# Patient Record
Sex: Female | Born: 1965 | Race: Black or African American | Hispanic: No | Marital: Married | Smoking: Never smoker
Health system: Southern US, Community
[De-identification: ages and names within clinical notes are randomized; demographics above are authoritative.]

## PROBLEM LIST (undated history)

## (undated) DIAGNOSIS — D649 Anemia, unspecified: Secondary | ICD-10-CM

## (undated) DIAGNOSIS — L709 Acne, unspecified: Secondary | ICD-10-CM

## (undated) HISTORY — PX: TUBAL LIGATION: SHX77

## (undated) HISTORY — PX: COLONOSCOPY: SHX174

## (undated) HISTORY — PX: EYE SURGERY: SHX253

---

## 2015-02-02 ENCOUNTER — Other Ambulatory Visit: Payer: Self-pay | Admitting: Obstetrics and Gynecology

## 2015-02-02 NOTE — Patient Instructions (Addendum)
   Your procedure is scheduled on:  Wednesday, April 6  Enter through the [employer] [facility] at: 10 AM Pick up the phone at the desk and dial [phone] and inform us of your arrival.  Please call this number if you have any problems the morning of surgery: [phone]  Remember: Do not eat or drink after midnight: Tuesday Take these medicines the morning of surgery with a SIP OF WATER:  None  Do not wear jewelry, make-up, or FINGER nail polish No metal in your hair or on your body. Do not wear lotions, powders, perfumes.  You may wear deodorant.  Do not bring valuables to the hospital. Contacts, dentures or bridgework may not be worn into surgery.  Leave suitcase in the car. After Surgery it may be brought to your room. For patients being admitted to the hospital, checkout time is 11:00am the day of discharge.  Home with boyfriend  Stark [facility] cell [phone]

## 2015-02-05 ENCOUNTER — Encounter (HOSPITAL_COMMUNITY)
Admission: RE | Admit: 2015-02-05 | Discharge: 2015-02-05 | Disposition: A | Discharge status: Home / Self Care | Payer: BLUE CROSS/BLUE SHIELD | Source: Ambulatory Visit | Attending: Obstetrics and Gynecology | Admitting: Obstetrics and Gynecology

## 2015-02-05 ENCOUNTER — Encounter (HOSPITAL_COMMUNITY): Payer: Self-pay

## 2015-02-05 ENCOUNTER — Other Ambulatory Visit (HOSPITAL_COMMUNITY): Payer: Self-pay

## 2015-02-05 DIAGNOSIS — Z01812 Encounter for preprocedural laboratory examination: Secondary | ICD-10-CM | POA: Insufficient documentation

## 2015-02-05 HISTORY — DX: Acne, unspecified: L70.9

## 2015-02-05 HISTORY — DX: Anemia, unspecified: D64.9

## 2015-02-05 LAB — CBC
HCT: 31.4 % — ABNORMAL LOW (ref 36.0–46.0)
HEMOGLOBIN: 9.6 g/dL — AB (ref 12.0–15.0)
MCH: 26 pg (ref 26.0–34.0)
MCHC: 30.6 g/dL (ref 30.0–36.0)
MCV: 85.1 fL (ref 78.0–100.0)
PLATELETS: 269 10*3/uL (ref 150–400)
RBC: 3.69 MIL/uL — ABNORMAL LOW (ref 3.87–5.11)
RDW: 14.4 % (ref 11.5–15.5)
WBC: 4.3 10*3/uL (ref 4.0–10.5)

## 2015-02-20 NOTE — H&P (Signed)
  Admission History and Physical Exam for a Gynecology Patient  Karen Sheppard is a 49 y.o. female, No obstetric history on file., who presents for a vaginal hysterectomy. She has been followed at the [facility] of [facility]. The patient complains of menorrhagia, fatigue, and anemia.  She has been treated with a Mirena IUD and with transamic acid.  Her most recent Pap smear was in April 2015.  It was within normal limits.  She denies a history of abnormal Pap smears.  She had endometrial biopsy that was benign.  An ultrasound showed an 8.5 x 7.7 cm uterus.  There is a 5.3 cm fibroid present.  The adnexa appeared normal.  The patient has had a cesarean section and a tubal sterilization procedure.  Obstetrical history:  The patient has had one cesarean section.  She is a gravida 1 para 1.  Past Medical History  Diagnosis Date  . Acne   . Anemia     No prescriptions prior to admission    Past Surgical History  Procedure Laterality Date  . Tubal ligation    . Eye surgery      bilateral cataract surgery  . Cesarean section      x 1  . Colonoscopy      Allergies  Allergen Reactions  . Latex Rash    Family History:   The patient's mother has diabetes and breast cancer.  The patient's father has diabetes and colon cancer.  Social History:  reports that she has never smoked. She has never used smokeless tobacco. She reports that she drinks alcohol. She reports that she does not use illicit drugs.  Review of systems: See HPI.  Admission Physical Exam:    BMI equals 27.1  HEENT:                 Within normal limits Chest:                   Clear Heart:                    Regular rate and rhythm Breasts:                No masses, skin changes, bleeding, or discharge present Abdomen:             Nontender, no masses Extremities:          Grossly normal Neurologic exam: Grossly normal  Pelvic exam:  External  genitalia: normal general appearance Vaginal: normal without tenderness, induration or masses Cervix: normal appearance Adnexa: normal bimanual exam Rectal: good sphincter tone  Uterus: Ten-week size, irregular  CBC    Component Value Date/Time   WBC 4.3 02/05/2015 0905   RBC 3.69* 02/05/2015 0905   HGB 9.6* 02/05/2015 0905   HCT 31.4* 02/05/2015 0905   PLT 269 02/05/2015 0905   MCV 85.1 02/05/2015 0905   MCH 26.0 02/05/2015 0905   MCHC 30.6 02/05/2015 0905   RDW 14.4 02/05/2015 0905    Assessment:  Menorrhagia  Fatigue  Anemia  Fibroid uterus  Prior cesarean section  Plan:  The patient will have a vaginal hysterectomy.  She understands the indications for her surgical procedure.  She accepts the risk of, but not limited to, anesthetic complications, bleeding, infections, and possible damage to surrounding organs.   Eli Hose 02/20/2015

## 2015-02-21 ENCOUNTER — Ambulatory Visit (HOSPITAL_COMMUNITY)
Admission: RE | Admit: 2015-02-21 | Discharge: 2015-02-22 | Disposition: A | Discharge status: Home / Self Care | Payer: BLUE CROSS/BLUE SHIELD | Source: Ambulatory Visit | Attending: Obstetrics and Gynecology | Admitting: Obstetrics and Gynecology

## 2015-02-21 ENCOUNTER — Ambulatory Visit (HOSPITAL_COMMUNITY): Payer: BLUE CROSS/BLUE SHIELD | Admitting: Certified Registered Nurse Anesthetist

## 2015-02-21 ENCOUNTER — Encounter (HOSPITAL_COMMUNITY): Payer: Self-pay

## 2015-02-21 ENCOUNTER — Encounter (HOSPITAL_COMMUNITY)
Admission: RE | Disposition: A | Discharge status: Home / Self Care | Payer: Self-pay | Source: Ambulatory Visit | Attending: Obstetrics and Gynecology

## 2015-02-21 DIAGNOSIS — N838 Other noninflammatory disorders of ovary, fallopian tube and broad ligament: Secondary | ICD-10-CM | POA: Insufficient documentation

## 2015-02-21 DIAGNOSIS — N800 Endometriosis of uterus: Secondary | ICD-10-CM | POA: Diagnosis not present

## 2015-02-21 DIAGNOSIS — N920 Excessive and frequent menstruation with regular cycle: Secondary | ICD-10-CM | POA: Insufficient documentation

## 2015-02-21 DIAGNOSIS — D259 Leiomyoma of uterus, unspecified: Secondary | ICD-10-CM | POA: Diagnosis not present

## 2015-02-21 DIAGNOSIS — D649 Anemia, unspecified: Secondary | ICD-10-CM | POA: Insufficient documentation

## 2015-02-21 DIAGNOSIS — D219 Benign neoplasm of connective and other soft tissue, unspecified: Secondary | ICD-10-CM | POA: Diagnosis present

## 2015-02-21 DIAGNOSIS — D251 Intramural leiomyoma of uterus: Secondary | ICD-10-CM

## 2015-02-21 HISTORY — PX: VAGINAL HYSTERECTOMY: SHX2639

## 2015-02-21 LAB — PREGNANCY, URINE: PREG TEST UR: NEGATIVE

## 2015-02-21 SURGERY — HYSTERECTOMY, VAGINAL
Anesthesia: General | Site: Vagina | Laterality: Right

## 2015-02-21 MED ORDER — LACTATED RINGERS IV SOLN
INTRAVENOUS | Status: DC
  Administered 2015-02-21: 11:00:00 via INTRAVENOUS
  Administered 2015-02-21: 125 mL/h via INTRAVENOUS

## 2015-02-21 MED ORDER — ONDANSETRON HCL 4 MG/2ML IJ SOLN
INTRAMUSCULAR | Status: DC | PRN
  Administered 2015-02-21: 4 mg via INTRAVENOUS

## 2015-02-21 MED ORDER — OXYCODONE-ACETAMINOPHEN 5-325 MG PO TABS
1.0000 | ORAL_TABLET | ORAL | Status: DC | PRN
  Administered 2015-02-22: 1 via ORAL
  Filled 2015-02-21: qty 1

## 2015-02-21 MED ORDER — NEOSTIGMINE METHYLSULFATE 10 MG/10ML IV SOLN
INTRAVENOUS | Status: AC
  Filled 2015-02-21: qty 1

## 2015-02-21 MED ORDER — MIDAZOLAM HCL 2 MG/2ML IJ SOLN
INTRAMUSCULAR | Status: DC | PRN
  Administered 2015-02-21: 2 mg via INTRAVENOUS

## 2015-02-21 MED ORDER — ONDANSETRON HCL 4 MG PO TABS: 4.0000 mg | ORAL_TABLET | Freq: Four times a day (QID) | ORAL | Status: DC | PRN

## 2015-02-21 MED ORDER — KETOROLAC TROMETHAMINE 30 MG/ML IJ SOLN: 30.0000 mg | Freq: Once | INTRAMUSCULAR | Status: DC | PRN

## 2015-02-21 MED ORDER — BUPIVACAINE-EPINEPHRINE (PF) 0.5% -1:200000 IJ SOLN
INTRAMUSCULAR | Status: AC
  Filled 2015-02-21: qty 30

## 2015-02-21 MED ORDER — ROCURONIUM BROMIDE 100 MG/10ML IV SOLN
INTRAVENOUS | Status: DC | PRN
  Administered 2015-02-21: 30 mg via INTRAVENOUS

## 2015-02-21 MED ORDER — DEXAMETHASONE SODIUM PHOSPHATE 4 MG/ML IJ SOLN
INTRAMUSCULAR | Status: AC
  Filled 2015-02-21: qty 1

## 2015-02-21 MED ORDER — FENTANYL CITRATE 0.05 MG/ML IJ SOLN
INTRAMUSCULAR | Status: AC
  Filled 2015-02-21: qty 5

## 2015-02-21 MED ORDER — EPHEDRINE SULFATE 50 MG/ML IJ SOLN
INTRAMUSCULAR | Status: DC | PRN
  Administered 2015-02-21: 5 mg via INTRAVENOUS

## 2015-02-21 MED ORDER — SCOPOLAMINE 1 MG/3DAYS TD PT72
1.0000 | MEDICATED_PATCH | Freq: Once | TRANSDERMAL | Status: DC
  Administered 2015-02-21: 1.5 mg via TRANSDERMAL

## 2015-02-21 MED ORDER — PROPOFOL 10 MG/ML IV BOLUS
INTRAVENOUS | Status: DC | PRN
  Administered 2015-02-21: 50 mg via INTRAVENOUS

## 2015-02-21 MED ORDER — DEXAMETHASONE SODIUM PHOSPHATE 10 MG/ML IJ SOLN
INTRAMUSCULAR | Status: DC | PRN
  Administered 2015-02-21: 4 mg via INTRAVENOUS

## 2015-02-21 MED ORDER — CEFAZOLIN SODIUM-DEXTROSE 2-3 GM-% IV SOLR
INTRAVENOUS | Status: AC
  Filled 2015-02-21: qty 50

## 2015-02-21 MED ORDER — EPHEDRINE 5 MG/ML INJ
INTRAVENOUS | Status: AC
  Filled 2015-02-21: qty 10

## 2015-02-21 MED ORDER — KETOROLAC TROMETHAMINE 30 MG/ML IJ SOLN
30.0000 mg | Freq: Four times a day (QID) | INTRAMUSCULAR | Status: DC
Start: 2015-02-21 — End: 2015-02-22

## 2015-02-21 MED ORDER — ACETAMINOPHEN 325 MG PO TABS: 325.0000 mg | ORAL_TABLET | ORAL | Status: DC | PRN

## 2015-02-21 MED ORDER — ONDANSETRON HCL 4 MG/2ML IJ SOLN: 4.0000 mg | Freq: Four times a day (QID) | INTRAMUSCULAR | Status: DC | PRN

## 2015-02-21 MED ORDER — VASOPRESSIN 20 UNIT/ML IV SOLN
INTRAVENOUS | Status: AC
  Filled 2015-02-21: qty 1

## 2015-02-21 MED ORDER — BUPIVACAINE LIPOSOME 1.3 % IJ SUSP
20.0000 mL | Freq: Once | INTRAMUSCULAR | Status: AC
  Administered 2015-02-21: 20 mL
  Filled 2015-02-21: qty 20

## 2015-02-21 MED ORDER — MIDAZOLAM HCL 2 MG/2ML IJ SOLN
INTRAMUSCULAR | Status: AC
  Filled 2015-02-21: qty 2

## 2015-02-21 MED ORDER — KETOROLAC TROMETHAMINE 30 MG/ML IJ SOLN
INTRAMUSCULAR | Status: AC
  Filled 2015-02-21: qty 1

## 2015-02-21 MED ORDER — GLYCOPYRROLATE 0.2 MG/ML IJ SOLN
INTRAMUSCULAR | Status: AC
Start: 2015-02-21 — End: 2015-02-21
  Filled 2015-02-21: qty 3

## 2015-02-21 MED ORDER — PROPOFOL 10 MG/ML IV BOLUS
INTRAVENOUS | Status: AC
  Filled 2015-02-21: qty 20

## 2015-02-21 MED ORDER — KETOROLAC TROMETHAMINE 30 MG/ML IJ SOLN
INTRAMUSCULAR | Status: DC | PRN
  Administered 2015-02-21: 30 mg via INTRAVENOUS

## 2015-02-21 MED ORDER — BUPIVACAINE-EPINEPHRINE 0.5% -1:200000 IJ SOLN
INTRAMUSCULAR | Status: DC | PRN
  Administered 2015-02-21: 20 mL

## 2015-02-21 MED ORDER — SODIUM CHLORIDE 0.9 % IJ SOLN
INTRAMUSCULAR | Status: AC
  Filled 2015-02-21: qty 100

## 2015-02-21 MED ORDER — GLYCOPYRROLATE 0.2 MG/ML IJ SOLN
INTRAMUSCULAR | Status: DC | PRN
  Administered 2015-02-21: 0.2 mg via INTRAVENOUS
  Administered 2015-02-21: 0.3 mg via INTRAVENOUS

## 2015-02-21 MED ORDER — MIDAZOLAM HCL 2 MG/2ML IJ SOLN: 0.5000 mg | Freq: Once | INTRAMUSCULAR | Status: DC | PRN

## 2015-02-21 MED ORDER — ACETAMINOPHEN 160 MG/5ML PO SOLN: 325.0000 mg | ORAL | Status: DC | PRN

## 2015-02-21 MED ORDER — IBUPROFEN 800 MG PO TABS: 800.0000 mg | ORAL_TABLET | Freq: Three times a day (TID) | ORAL | Status: DC | PRN

## 2015-02-21 MED ORDER — FENTANYL CITRATE 0.05 MG/ML IJ SOLN: 25.0000 ug | INTRAMUSCULAR | Status: DC | PRN

## 2015-02-21 MED ORDER — CEFAZOLIN SODIUM-DEXTROSE 2-3 GM-% IV SOLR
2.0000 g | INTRAVENOUS | Status: AC
  Administered 2015-02-21: 2 g via INTRAVENOUS

## 2015-02-21 MED ORDER — KETOROLAC TROMETHAMINE 30 MG/ML IJ SOLN
30.0000 mg | Freq: Four times a day (QID) | INTRAMUSCULAR | Status: DC
  Administered 2015-02-21 – 2015-02-22 (×3): 30 mg via INTRAVENOUS
  Filled 2015-02-21 (×3): qty 1

## 2015-02-21 MED ORDER — DEXTROSE IN LACTATED RINGERS 5 % IV SOLN
INTRAVENOUS | Status: DC
  Administered 2015-02-21 (×2): via INTRAVENOUS

## 2015-02-21 MED ORDER — GLYCOPYRROLATE 0.2 MG/ML IJ SOLN
INTRAMUSCULAR | Status: AC
  Filled 2015-02-21: qty 1

## 2015-02-21 MED ORDER — FENTANYL CITRATE 0.05 MG/ML IJ SOLN
INTRAMUSCULAR | Status: DC | PRN
  Administered 2015-02-21 (×5): 50 ug via INTRAVENOUS

## 2015-02-21 MED ORDER — ONDANSETRON HCL 4 MG/2ML IJ SOLN
INTRAMUSCULAR | Status: AC
  Filled 2015-02-21: qty 2

## 2015-02-21 MED ORDER — SIMETHICONE 80 MG PO CHEW: 80.0000 mg | CHEWABLE_TABLET | Freq: Four times a day (QID) | ORAL | Status: DC | PRN

## 2015-02-21 MED ORDER — NEOSTIGMINE METHYLSULFATE 10 MG/10ML IV SOLN
INTRAVENOUS | Status: DC | PRN
Start: 2015-02-21 — End: 2015-02-21
  Administered 2015-02-21: 2 mg via INTRAVENOUS

## 2015-02-21 MED ORDER — LIDOCAINE HCL (CARDIAC) 20 MG/ML IV SOLN
INTRAVENOUS | Status: DC | PRN
  Administered 2015-02-21: 80 mg via INTRAVENOUS

## 2015-02-21 MED ORDER — SCOPOLAMINE 1 MG/3DAYS TD PT72
MEDICATED_PATCH | TRANSDERMAL | Status: AC
  Administered 2015-02-21: 1.5 mg via TRANSDERMAL
  Filled 2015-02-21: qty 1

## 2015-02-21 MED ORDER — LIDOCAINE HCL (CARDIAC) 20 MG/ML IV SOLN
INTRAVENOUS | Status: AC
  Filled 2015-02-21: qty 5

## 2015-02-21 MED ORDER — PROMETHAZINE HCL 25 MG/ML IJ SOLN: 6.2500 mg | INTRAMUSCULAR | Status: DC | PRN

## 2015-02-21 MED ORDER — ROCURONIUM BROMIDE 100 MG/10ML IV SOLN
INTRAVENOUS | Status: AC
  Filled 2015-02-21: qty 1

## 2015-02-21 MED ORDER — MEPERIDINE HCL 25 MG/ML IJ SOLN: 6.2500 mg | INTRAMUSCULAR | Status: DC | PRN

## 2015-02-21 SURGICAL SUPPLY — 28 items
CANISTER SUCT 3000ML (MISCELLANEOUS) ×3 IMPLANT
CLOTH BEACON ORANGE TIMEOUT ST (SAFETY) ×3 IMPLANT
CONT PATH 16OZ SNAP LID 3702 (MISCELLANEOUS) IMPLANT
DECANTER SPIKE VIAL GLASS SM (MISCELLANEOUS) IMPLANT
DRAPE SHEET LG 3/4 BI-LAMINATE (DRAPES) ×6 IMPLANT
DRSG TELFA 3X8 NADH (GAUZE/BANDAGES/DRESSINGS) ×3 IMPLANT
GLOVE BIOGEL PI IND STRL 6.5 (GLOVE) ×1 IMPLANT
GLOVE BIOGEL PI IND STRL 8.5 (GLOVE) ×1 IMPLANT
GLOVE BIOGEL PI INDICATOR 6.5 (GLOVE) ×2
GLOVE BIOGEL PI INDICATOR 8.5 (GLOVE) ×2
GLOVE ECLIPSE 8.0 STRL XLNG CF (GLOVE) ×6 IMPLANT
GOWN STRL REUS W/TWL LRG LVL3 (GOWN DISPOSABLE) ×12 IMPLANT
NEEDLE HYPO 22GX1.5 SAFETY (NEEDLE) IMPLANT
NEEDLE MAYO .5 CIRCLE (NEEDLE) ×3 IMPLANT
NEEDLE SPNL 22GX3.5 QUINCKE BK (NEEDLE) IMPLANT
NS IRRIG 1000ML POUR BTL (IV SOLUTION) ×3 IMPLANT
PACK VAGINAL WOMENS (CUSTOM PROCEDURE TRAY) ×3 IMPLANT
PAD OB MATERNITY 4.3X12.25 (PERSONAL CARE ITEMS) ×3 IMPLANT
SUT CHROMIC 2 0 TIES 18 (SUTURE) IMPLANT
SUT VIC AB 0 CT1 18XCR BRD8 (SUTURE) ×3 IMPLANT
SUT VIC AB 0 CT1 27 (SUTURE) ×2
SUT VIC AB 0 CT1 27XBRD ANBCTR (SUTURE) ×1 IMPLANT
SUT VIC AB 0 CT1 8-18 (SUTURE) ×6
SUT VICRYL 0 TIES 12 18 (SUTURE) ×3 IMPLANT
SYR TB 1ML 25GX5/8 (SYRINGE) ×3 IMPLANT
TOWEL OR 17X24 6PK STRL BLUE (TOWEL DISPOSABLE) ×6 IMPLANT
TRAY FOLEY CATH SILVER 14FR (SET/KITS/TRAYS/PACK) ×3 IMPLANT
WATER STERILE IRR 1000ML POUR (IV SOLUTION) ×3 IMPLANT

## 2015-02-21 NOTE — H&P (Signed)
The patient was interviewed and examined today.  The previously documented history and physical examination was reviewed. There are no changes. The operative procedure was reviewed. The risks and benefits were outlined again. The specific risks include, but are not limited to, anesthetic complications, bleeding, infections, and possible damage to the surrounding organs. The patient's questions were answered.  We are ready to proceed as outlined. The likelihood of the patient achieving the goals of this procedure is very likely.   BP 146/90 mmHg  Pulse 64  Temp(Src) 98.2 F (36.8 C) (Oral)  Resp 16  SpO2 100%  Results for orders placed or performed during the hospital encounter of 02/21/15 (from the past 24 hour(s))  Pregnancy, urine     Status: None   Collection Time: 02/21/15  9:40 AM  Result Value Ref Range   Preg Test, Ur NEGATIVE NEGATIVE    Gildardo Cranker, M.D.

## 2015-02-21 NOTE — Anesthesia Preprocedure Evaluation (Signed)
Anesthesia Evaluation  Patient identified by MRN, date of birth, ID band Patient awake    Reviewed: Allergy & Precautions, H&P , Patient's Chart, lab work & pertinent test results, reviewed documented beta blocker date and time   History of Anesthesia Complications Negative for: history of anesthetic complications  Airway Mallampati: II  TM Distance: >3 FB Neck ROM: full    Dental   Pulmonary  breath sounds clear to auscultation        Cardiovascular Exercise Tolerance: Good Rhythm:regular Rate:Normal     Neuro/Psych    GI/Hepatic   Endo/Other    Renal/GU      Musculoskeletal   Abdominal   Peds  Hematology  (+) anemia ,   Anesthesia Other Findings   Reproductive/Obstetrics                             Anesthesia Physical Anesthesia Plan  ASA: II  Anesthesia Plan: General ETT   Post-op Pain Management:    Induction:   Airway Management Planned:   Additional Equipment:   Intra-op Plan:   Post-operative Plan:   Informed Consent: I have reviewed the patients History and Physical, chart, labs and discussed the procedure including the risks, benefits and alternatives for the proposed anesthesia with the patient or authorized representative who has indicated his/her understanding and acceptance.   Dental Advisory Given  Plan Discussed with: CRNA and Surgeon  Anesthesia Plan Comments:         Anesthesia Quick Evaluation

## 2015-02-21 NOTE — Anesthesia Postprocedure Evaluation (Signed)
Anesthesia Post Note  Patient: Karen Sheppard  Procedure(s) Performed: Procedure(s) (LRB): HYSTERECTOMY VAGINAL with  right Salpingectomy (Right)  Anesthesia type: General  Patient location: [facility]  Post pain: Pain level controlled  Post assessment: Post-op Vital signs reviewed  Last Vitals:  Filed Vitals:   02/21/15 1530  BP: 161/87  Pulse: 58  Temp: 36.6 C  Resp: 18    Post vital signs: Reviewed  Level of consciousness: sedated  Complications: No apparent anesthesia complications

## 2015-02-21 NOTE — Addendum Note (Signed)
Addendum  created 02/21/15 1803 by Asher Muir, CRNA   Modules edited: Notes Section   Notes Section:  File: [id number]

## 2015-02-21 NOTE — Op Note (Signed)
OPERATIVE NOTE  Karen Sheppard  DOB:    July 28, 1966  MRN:    [id number]  CSN:    [id number]  Date of Surgery:  02/21/2015  Preoperative Diagnosis:  Fibroid uterus  Menorrhagia  Anemia  Postoperative Diagnosis:  Same  Procedure:  Vaginal hysterectomy  Right salpingectomy  Surgeon:  Gildardo Cranker, M.D.  Assistant:  Kendall Flack, M.D.  Anesthetic:  General  Disposition:  The patient presents with the above-mentioned diagnosis. She understands the indications for surgical procedure.  She also understands the alternative treatment options. She accepts the risk of, but not limited to, anesthetic complications, bleeding, infections, and possible damage to the surrounding organs.  Findings:  The uterus weighed 208 g. It was approximately 10 weeks size. It contained multiple fibroids with the largest fibroid measuring 5 cm. The ovaries were normal bilaterally. The right fallopian tube was normal except for a defect from the prior tubal sterilization procedure. The left fallopian tube could not be visualized.  Procedure:  The patient was taken to the operating room where a general anesthetic was given. The patient's lower abdomen, perineum, and vagina were prepped with multiple layers of Betadine. A Foley catheter was placed in the bladder. The patient was sterilely draped. An examination under anesthesia was performed. Operative findings are mentioned above. The cervix was injected with half percent Marcaine with epinephrine. A circumferential incision was made around the cervix. The vaginal mucosa was advanced anteriorly and posteriorly. The anterior cul-de-sac and in the posterior cul-de-sac were sharply entered. Alternating from right to left the uterosacral ligaments, paracervical tissues, parametrial tissues, and uterine arteries were clamped, cut, sutured, and tied securely. The upper pedicles were then clamped and cut. The uterus was removed from the operative  field. The upper pedicles were secured using free ties and then suture ligatures. The right fallopian tube was identified. The mesosalpinx was clamped and cut removing the entire tube. Interrupted sutures were used for hemostasis. The left fallopian tube could not be identified. Hemostasis was confirmed. The sutures attached to the uterosacral ligaments were brought out through the vaginal angles and then tied securely. Two McCall culdoplasty sutures were placed in the posterior cul-de-sac incorporating the uterosacral ligaments bilaterally and the posterior peritoneum. A final check was made for hemostasis and again hemostasis was confirmed. Exporel was placed along the suture lines and in the posterior cul-de-sac. The vaginal cuff was closed using figure-of-eight sutures incorporating the anterior vaginal mucosa, the anterior peritoneum, posterior peritoneum, and the posterior vaginal mucosa. The McCall culdoplasty suture was tied securely and the apex of the vagina was noted to elevate into the midpelvis. The patient tolerated her procedure well. She was awakened from her anesthetic without difficulty and then transported to the recovery room in stable condition. Sponge, needle, and instrument counts were correct on 2 occasions. The estimated blood loss was 100 cc's. 0 Vicryl is the suture material used throughout the procedure. The uterus and right tube were sent to pathology.  Gildardo Cranker, M.D.  02/21/2015

## 2015-02-21 NOTE — Anesthesia Postprocedure Evaluation (Signed)
  Anesthesia Post Note  Patient: Karen Sheppard  Procedure(s) Performed: Procedure(s) (LRB): HYSTERECTOMY VAGINAL with  right Salpingectomy (Right)  Anesthesia type: GA  Patient location: PACU  Post pain: Pain level controlled  Post assessment: Post-op Vital signs reviewed  Last Vitals:  Filed Vitals:   02/21/15 1300  BP:   Pulse: 56  Temp:   Resp: 16    Post vital signs: Reviewed  Level of consciousness: sedated  Complications: No apparent anesthesia complications

## 2015-02-21 NOTE — Anesthesia Procedure Notes (Signed)
Procedure Name: Intubation Date/Time: 02/21/2015 10:49 AM Performed by: Raenette Rover Pre-anesthesia Checklist: Patient identified, Emergency Drugs available, Suction available and Patient being monitored Patient Re-evaluated:Patient Re-evaluated prior to inductionOxygen Delivery Method: Circle system utilized Preoxygenation: Pre-oxygenation with 100% oxygen Intubation Type: Combination inhalational/ intravenous induction Ventilation: Mask ventilation without difficulty Laryngoscope Size: Mac and 3 Grade View: Grade I Tube type: Oral Tube size: 7.0 mm Number of attempts: 1 Airway Equipment and Method: Stylet Placement Confirmation: ETT inserted through vocal cords under direct vision,  positive ETCO2,  CO2 detector and breath sounds checked- equal and bilateral Secured at: 21 cm Tube secured with: Tape Dental Injury: Teeth and Oropharynx as per pre-operative assessment

## 2015-02-21 NOTE — Progress Notes (Signed)
Day of Surgery Procedure(s) (LRB): HYSTERECTOMY VAGINAL with  right Salpingectomy (Right)  Subjective: Patient reports tolerating PO.    Objective: I have reviewed patient's vital signs and intake and output.  Resp: clear to auscultation bilaterally Cardio: regular rate and rhythm, S1, S2 normal, no murmur, click, rub or gallop GI: soft, non-tender; bowel sounds normal; no masses,  no organomegaly Extremities: extremities normal, atraumatic, no cyanosis or edema Vaginal Bleeding: none  Assessment: s/p Procedure(s): HYSTERECTOMY VAGINAL with  right Salpingectomy: stable  Plan: Encourage ambulation Routine post op care.  Watch BP. Plan discharge in the morning.     Karen Sheppard 02/21/2015, 5:41 PM

## 2015-02-21 NOTE — Transfer of Care (Signed)
Immediate Anesthesia Transfer of Care Note  Patient: Karen Sheppard  Procedure(s) Performed: Procedure(s): HYSTERECTOMY VAGINAL with  right Salpingectomy (Right)  Patient Location: PACU  Anesthesia Type:General  Level of Consciousness: awake, alert , oriented and patient cooperative  Airway & Oxygen Therapy: Patient Spontanous Breathing and Patient connected to nasal cannula oxygen  Post-op Assessment: Report given to RN and Post -op Vital signs reviewed and stable  Post vital signs: Reviewed and stable  Last Vitals:  Filed Vitals:   02/21/15 0942  BP: 146/90  Pulse: 64  Temp: 36.8 C  Resp: 16    Complications: No apparent anesthesia complications

## 2015-02-22 ENCOUNTER — Encounter (HOSPITAL_COMMUNITY): Payer: Self-pay | Admitting: Obstetrics and Gynecology

## 2015-02-22 DIAGNOSIS — D259 Leiomyoma of uterus, unspecified: Secondary | ICD-10-CM | POA: Diagnosis not present

## 2015-02-22 LAB — CBC
HEMATOCRIT: 30.5 % — AB (ref 36.0–46.0)
Hemoglobin: 9.8 g/dL — ABNORMAL LOW (ref 12.0–15.0)
MCH: 28 pg (ref 26.0–34.0)
MCHC: 32.1 g/dL (ref 30.0–36.0)
MCV: 87.1 fL (ref 78.0–100.0)
PLATELETS: 230 10*3/uL (ref 150–400)
RBC: 3.5 MIL/uL — ABNORMAL LOW (ref 3.87–5.11)
RDW: 20.8 % — AB (ref 11.5–15.5)
WBC: 10.7 10*3/uL — AB (ref 4.0–10.5)

## 2015-02-22 MED ORDER — PROMETHAZINE HCL 12.5 MG PO TABS: 12.5000 mg | ORAL_TABLET | Freq: Four times a day (QID) | ORAL | Status: AC | PRN

## 2015-02-22 MED ORDER — OXYCODONE-ACETAMINOPHEN 5-325 MG PO TABS: 1.0000 | ORAL_TABLET | ORAL | Status: AC | PRN

## 2015-02-22 MED ORDER — IBUPROFEN 800 MG PO TABS: 800.0000 mg | ORAL_TABLET | Freq: Three times a day (TID) | ORAL | Status: AC | PRN

## 2015-02-22 NOTE — Progress Notes (Signed)
1 Day Post-Op Procedure(s) (LRB): HYSTERECTOMY VAGINAL with  right Salpingectomy (Right)  Subjective: Patient reports tolerating PO and + flatus.    Objective: I have reviewed patient's vital signs, intake and output and labs.  General: alert Resp: clear to auscultation bilaterally Cardio: regular rate and rhythm, S1, S2 normal, no murmur, click, rub or gallop GI: soft, non-tender; bowel sounds normal; no masses,  no organomegaly Extremities: extremities normal, atraumatic, no cyanosis or edema Vaginal Bleeding: minimal   CBC    Component Value Date/Time   WBC 10.7* 02/22/2015 0523   RBC 3.50* 02/22/2015 0523   HGB 9.8* 02/22/2015 0523   HCT 30.5* 02/22/2015 0523   PLT 230 02/22/2015 0523   MCV 87.1 02/22/2015 0523   MCH 28.0 02/22/2015 0523   MCHC 32.1 02/22/2015 0523   RDW 20.8* 02/22/2015 0523    +  Assessment: s/p Procedure(s): HYSTERECTOMY VAGINAL with  right Salpingectomy: stable, tolerating diet and anemia  Plan: Discharge home     Karen Sheppard V 02/22/2015, 6:56 AM

## 2015-02-22 NOTE — Progress Notes (Signed)
Pt discharged home with significant other... Discharge instructions reviewed with pt and she verbalized understanding... Condition stable... No equipment... Ambulated to car with Varney Baas, NT.

## 2015-02-22 NOTE — Discharge Instructions (Signed)
Hysterectomy Information  A hysterectomy is a surgery to remove your uterus. After surgery, you will no longer have periods. Also, you will not be able to get pregnant.  REASONS FOR THIS SURGERY  You have bleeding that is not normal and keeps coming back.  You have lasting (chronic) lower belly (pelvic) pain.  You have a lasting infection.  The lining of your uterus grows outside your uterus.  The lining of your uterus grows in the muscle of your uterus.  Your uterus falls down into your vagina.  You have a growth in your uterus that causes problems.  You have cells that could turn into cancer (precancerous cells).  You have cancer of the uterus or cervix. TYPES  There are 3 types of hysterectomies. Depending on the type, the surgery will:  Remove the top part of the uterus only.  Remove the uterus and the cervix.  Remove the uterus, cervix, and tissue that holds the uterus in place in the lower belly. WAYS A HYSTERECTOMY CAN BE PERFORMED There are 5 ways this surgery can be performed.   A cut (incision) is made in the belly (abdomen). The uterus is taken out through the cut.  A cut is made in the vagina. The uterus is taken out through the cut.  Three or four cuts are made in the belly. A surgical device with a camera is put through one of the cuts. The uterus is cut into small pieces. The uterus is taken out through the cuts or the vagina.  Three or four cuts are made in the belly. A surgical device with a camera is put through one of the cuts. The uterus is taken out through the vagina.  Three or four cuts are made in the belly. A surgical device that is controlled by a computer makes a visual image. The device helps the surgeon control the surgical tools. The uterus is cut into small pieces. The pieces are taken out through the cuts or through the vagina. WHAT TO EXPECT AFTER THE SURGERY  You will be given pain medicine.  You will need help at home for 3-5 days after  surgery.  You will need to see your doctor in 2-4 weeks after surgery.  You may get hot flashes, have night sweats, and have trouble sleeping.  You may need to have Pap tests in the future if your surgery was related to cancer. Talk to your doctor. It is still good to have regular exams. Document Released: 01/26/2012 Document Revised: 08/24/2013 Document Reviewed: 07/11/2013 [facility] Patient Information 2015 [place], [place]. This information is not intended to replace advice given to you by your health care provider. Make sure you discuss any questions you have with your health care provider.

## 2015-03-25 NOTE — Discharge Planning (Cosign Needed)
Physician Discharge Summary  Patient ID: Karen Sheppard MRN: [id number] DOB/AGE: [birth year] 49 y.o.  Admit date: 02/21/2015 Discharge date: 03/25/2015   Discharge Diagnoses: Symptomatic Uterine Fibroids, Menorrhagia and Anemia Active Problems:   Fibroids   Operation: Total Vaginal Hysterectomy with Right Salpingectomy   Discharged Condition: Good  Hospital Course: On the date of admission, the patient underwent the aforementioned procedures and tolerated them well.  Post operative course was unremarkable with the patient tolerating a post op  hemoglobin/hematacrit of 9.8/30.5  (pre-op = 9.6/31.4)  and resuming bowel and bladder function by post operative day #1.  Having achieve the maximum benefit of her hospital stay,  the patient was deemed ready for discharge home on post op day #1  Disposition: 01-Home or Self Care  Discharge Medications:    Medication List    STOP taking these medications        tranexamic acid 650 MG Tabs tablet  Commonly known as:  LYSTEDA      TAKE these medications        Dapsone 5 % topical gel  Apply 1 application topically 2 (two) times daily.     Ferrous Fumarate-Folic Acid 734-0 MG Tabs  Take 2 tablets by mouth daily.     ibuprofen 800 MG tablet  Commonly known as:  ADVIL,MOTRIN  Take 1 tablet (800 mg total) by mouth every 8 (eight) hours as needed.     L-LYSINE PO  Take 2 tablets by mouth daily.     oxyCODONE-acetaminophen 5-325 MG per tablet  Commonly known as:  ROXICET  Take 1 tablet by mouth every 4 (four) hours as needed for severe pain.     promethazine 12.5 MG tablet  Commonly known as:  PHENERGAN  Take 1 tablet (12.5 mg total) by mouth every 6 (six) hours as needed for nausea or vomiting.     tazarotene 0.05 % cream  Commonly known as:  TAZORAC  Apply 1 application topically at bedtime.         Follow-up: Dr. Eli Hose in March 08, 2015 at 10:15 a.m.    SignedEarnstine Regal, PA-C 03/25/2015, 9:53 PM

## 2017-11-10 ENCOUNTER — Emergency Department
Admission: EM | Admit: 2017-11-10 | Discharge: 2017-11-10 | Disposition: A | Discharge status: Home / Self Care | Payer: BLUE CROSS/BLUE SHIELD | Attending: Emergency Medicine | Admitting: Emergency Medicine

## 2017-11-10 ENCOUNTER — Emergency Department: Payer: BLUE CROSS/BLUE SHIELD

## 2017-11-10 ENCOUNTER — Encounter: Payer: Self-pay | Admitting: Emergency Medicine

## 2017-11-10 DIAGNOSIS — Z79899 Other long term (current) drug therapy: Secondary | ICD-10-CM | POA: Insufficient documentation

## 2017-11-10 DIAGNOSIS — K5900 Constipation, unspecified: Secondary | ICD-10-CM | POA: Insufficient documentation

## 2017-11-10 MED ORDER — MAGNESIUM CITRATE PO SOLN
1.0000 | Freq: Once | ORAL | Status: AC
  Administered 2017-11-10: 1 via ORAL
  Filled 2017-11-10: qty 296

## 2017-11-10 MED ORDER — MINERAL OIL RE ENEM
1.0000 | ENEMA | Freq: Once | RECTAL | Status: AC
  Administered 2017-11-10: 1 via RECTAL

## 2017-11-10 MED ORDER — MAGNESIUM CITRATE PO SOLN: 1.0000 | Freq: Once | ORAL | 0 refills | Status: AC

## 2017-11-10 NOTE — ED Triage Notes (Signed)
Pt reports unable to have BM since Thursday despite OTC laxative and treatment. Reports nausea. Reports some abd cramping.

## 2017-11-10 NOTE — ED Notes (Signed)

## 2017-11-11 NOTE — ED Provider Notes (Signed)
[facility] Emergency Department Provider Note   ____________________________________________    I have reviewed the triage vital signs and the nursing notes.   HISTORY  Chief Complaint Constipation     HPI Karen Sheppard is a 51 y.o. female who presents with constipation.  Patient reports she has not a bowel movement in 5 days.  She reports over the last several months she has had more difficulty with constipation she is frequently having to take laxatives every few days.  She feels bloated today and uncomfortable but no acute pain.  Had a colonoscopy in March which was normal.  She has tried Dulcolax without success.  She is tried an enema without success.   Past Medical History:  Diagnosis Date  . Acne   . Anemia     Patient Active Problem List   Diagnosis Date Noted  . Fibroids 02/21/2015    Past Surgical History:  Procedure Laterality Date  . CESAREAN SECTION     x 1  . COLONOSCOPY    . EYE SURGERY     bilateral cataract surgery  . TUBAL LIGATION    . VAGINAL HYSTERECTOMY Right 02/21/2015   Procedure: HYSTERECTOMY VAGINAL with  right Salpingectomy;  Surgeon: Ena Dawley, MD;  Location: [facility];  Service: Gynecology;  Laterality: Right;    Prior to Admission medications   Medication Sig Start Date End Date Taking? Authorizing Provider  Dapsone 5 % topical gel Apply 1 application topically 2 (two) times daily.    [provider]  Ferrous Fumarate-Folic Acid 332-9 MG TABS Take 2 tablets by mouth daily.    [provider]  ibuprofen (ADVIL,MOTRIN) 800 MG tablet Take 1 tablet (800 mg total) by mouth every 8 (eight) hours as needed. 02/22/15   Ena Dawley, MD  L-LYSINE PO Take 2 tablets by mouth daily.    [provider]  oxyCODONE-acetaminophen (ROXICET) 5-325 MG per tablet Take 1 tablet by mouth every 4 (four) hours as needed for severe pain. 02/22/15   Ena Dawley, MD  promethazine (PHENERGAN) 12.5 MG  tablet Take 1 tablet (12.5 mg total) by mouth every 6 (six) hours as needed for nausea or vomiting. 02/22/15   Ena Dawley, MD  tazarotene (TAZORAC) 0.05 % cream Apply 1 application topically at bedtime.    [provider]     Allergies Latex  No family history on file.  Social History Social History   Tobacco Use  . Smoking status: Never Smoker  . Smokeless tobacco: Never Used  Substance Use Topics  . Alcohol use: Yes    Comment: wine daily 2 glasses  . Drug use: No    Review of Systems  Constitutional: No fever/chills Eyes: No visual changes.  ENT: No sore throat. Cardiovascular: Denies chest pain. Respiratory: No cough Gastrointestinal: As above Genitourinary: Negative for dysuria. Musculoskeletal: Mild discomfort in the back Skin: Negative for rash. Neurological: Negative for headaches   ____________________________________________   PHYSICAL EXAM:  VITAL SIGNS: ED Triage Vitals  Enc Vitals Group     BP 11/10/17 1814 (!) 158/100     Pulse Rate 11/10/17 1814 61     Resp 11/10/17 1814 18     Temp 11/10/17 1814 98.1 F (36.7 C)     Temp Source 11/10/17 1814 Oral     SpO2 11/10/17 1814 98 %     Weight 11/10/17 1814 90.7 kg (200 lb)     Height 11/10/17 1814 1.778 m (5\' 10" )  Head Circumference --      Peak Flow --      Pain Score 11/10/17 1819 5     Pain Loc --      Pain Edu? --      Excl. in [place]? --     Constitutional: Alert and oriented. No acute distress.  Eyes: Conjunctivae are normal.   Nose: No congestion/rhinnorhea. Mouth/Throat: Mucous membranes are moist.    Cardiovascular: Normal rate, Good peripheral circulation. Respiratory: Normal respiratory effort.  No retractions Gastrointestinal: Soft and nontender. No distention.  No CVA tenderness. Genitourinary: deferred Musculoskeletal: Warm and well perfused Neurologic:  Normal speech and language. No gross focal neurologic deficits are appreciated.  Skin:  Skin is warm, dry  and intact. No rash noted. Psychiatric: Mood and affect are normal. Speech and behavior are normal.  ____________________________________________   LABS (all labs ordered are listed, but only abnormal results are displayed)  Labs Reviewed - No data to display ____________________________________________  EKG  None ____________________________________________  RADIOLOGY  KUB shows large stool burden ____________________________________________   PROCEDURES  Procedure(s) performed: No  Procedures   Critical Care performed: No ____________________________________________   INITIAL IMPRESSION / ASSESSMENT AND PLAN / ED COURSE  Pertinent labs & imaging results that were available during my care of the patient were reviewed by me and considered in my medical decision making (see chart for details).  Patient presents with constipation.  KUB shows large stool burden, not consistent with impaction.  Patient treated with enema and magnesium citrate.  Has not had a bowel movement but asked to go home for further management, we discussed using MiraLAX daily, additional magnesium citrate    ____________________________________________   FINAL CLINICAL IMPRESSION(S) / ED DIAGNOSES  Final diagnoses:  Constipation, unspecified constipation type        Note:  This document was prepared using Dragon voice recognition software and may include unintentional dictation errors.    Lavonia Drafts, MD 11/11/17 [phone]

## 2019-07-14 DIAGNOSIS — D649 Anemia, unspecified: Secondary | ICD-10-CM | POA: Insufficient documentation

## 2019-07-15 ENCOUNTER — Ambulatory Visit: Payer: BC Managed Care – PPO | Admitting: Podiatry

## 2019-07-15 ENCOUNTER — Encounter: Payer: Self-pay | Admitting: Podiatry

## 2019-07-15 ENCOUNTER — Ambulatory Visit (INDEPENDENT_AMBULATORY_CARE_PROVIDER_SITE_OTHER): Payer: BC Managed Care – PPO

## 2019-07-15 ENCOUNTER — Other Ambulatory Visit: Payer: Self-pay

## 2019-07-15 VITALS — BP 158/90 | HR 51

## 2019-07-15 DIAGNOSIS — M7661 Achilles tendinitis, right leg: Secondary | ICD-10-CM

## 2019-07-15 DIAGNOSIS — M7752 Other enthesopathy of left foot: Secondary | ICD-10-CM

## 2019-07-15 MED ORDER — METHYLPREDNISOLONE 4 MG PO TBPK: ORAL_TABLET | ORAL | 0 refills | Status: AC

## 2019-07-18 NOTE — Progress Notes (Signed)
   HPI: 53 y.o. female presenting today as a new patient with a chief complaint of cracking skin on the right plantar foot and posterior heel that began about one year ago. She reports a painful nodule under the 5th digit of the right foot as well. She also complains of sharp pain noted to the dorsal left foot. Walking increases the pain. She has not had any treatment for the symptoms. Patient is here for further evaluation and treatment.   Past Medical History:  Diagnosis Date  . Acne   . Anemia      Physical Exam: General: The patient is alert and oriented x3 in no acute distress.  Dermatology: Fissure/callus noted to the right plantar forefoot. Skin is warm, dry and supple bilateral lower extremities.   Vascular: Palpable pedal pulses bilaterally. No edema or erythema noted. Capillary refill within normal limits.  Neurological: Epicritic and protective threshold grossly intact bilaterally.   Musculoskeletal Exam: Range of motion within normal limits to all pedal and ankle joints bilateral. Muscle strength 5/5 in all groups bilateral.   Radiographic Exam:  Normal osseous mineralization. Joint spaces preserved. No fracture/dislocation/boney destruction.    Assessment: 1. Bursitis right 5th MPJ 2. Fissure / callus right plantar forefoot   Plan of Care:  1. Patient evaluated. X-Rays reviewed.  2. Prescription for Medrol Dose Pak provided to patient. 3. Recommended OTC corn and callus remover.  4. Recommended not going barefoot.  5. Return to clinic as needed.     Edrick Kins, DPM [facility]  Dr. Edrick Kins, DPM    [address], [place] [address]                Office [phone]  Fax [phone]

## 2020-02-15 ENCOUNTER — Other Ambulatory Visit: Payer: Self-pay | Admitting: Obstetrics & Gynecology

## 2020-02-15 DIAGNOSIS — R928 Other abnormal and inconclusive findings on diagnostic imaging of breast: Secondary | ICD-10-CM

## 2020-03-01 ENCOUNTER — Other Ambulatory Visit: Payer: Self-pay

## 2020-03-01 ENCOUNTER — Other Ambulatory Visit: Payer: Self-pay | Admitting: Obstetrics & Gynecology

## 2020-03-01 ENCOUNTER — Ambulatory Visit
Admission: RE | Admit: 2020-03-01 | Discharge: 2020-03-01 | Disposition: A | Discharge status: Home / Self Care | Payer: BLUE CROSS/BLUE SHIELD | Source: Ambulatory Visit | Attending: Obstetrics & Gynecology | Admitting: Obstetrics & Gynecology

## 2020-03-01 DIAGNOSIS — R928 Other abnormal and inconclusive findings on diagnostic imaging of breast: Secondary | ICD-10-CM

## 2020-09-03 ENCOUNTER — Ambulatory Visit
Admission: RE | Admit: 2020-09-03 | Discharge: 2020-09-03 | Disposition: A | Discharge status: Home / Self Care | Payer: Commercial Managed Care - PPO | Source: Ambulatory Visit | Attending: Obstetrics & Gynecology | Admitting: Obstetrics & Gynecology

## 2020-09-03 ENCOUNTER — Ambulatory Visit
Admission: RE | Admit: 2020-09-03 | Discharge: 2020-09-03 | Disposition: A | Discharge status: Home / Self Care | Payer: Self-pay | Source: Ambulatory Visit | Attending: Obstetrics & Gynecology | Admitting: Obstetrics & Gynecology

## 2020-09-03 ENCOUNTER — Other Ambulatory Visit: Payer: Self-pay

## 2020-09-03 DIAGNOSIS — R928 Other abnormal and inconclusive findings on diagnostic imaging of breast: Secondary | ICD-10-CM

## 2022-04-29 IMAGING — MG MM DIGITAL DIAGNOSTIC UNILAT*R* W/ TOMO W/ CAD
4 series · 4 of 12 positions shown · non-contrast
Comparison: Previous exam(s).

CLINICAL DATA: 54-year-old female for six-month follow-up of 2
RIGHT breast masses.

EXAM:
DIGITAL DIAGNOSTIC RIGHT MAMMOGRAM WITH CAD AND TOMO
ULTRASOUND RIGHT BREAST

[R MLO synth-2D]
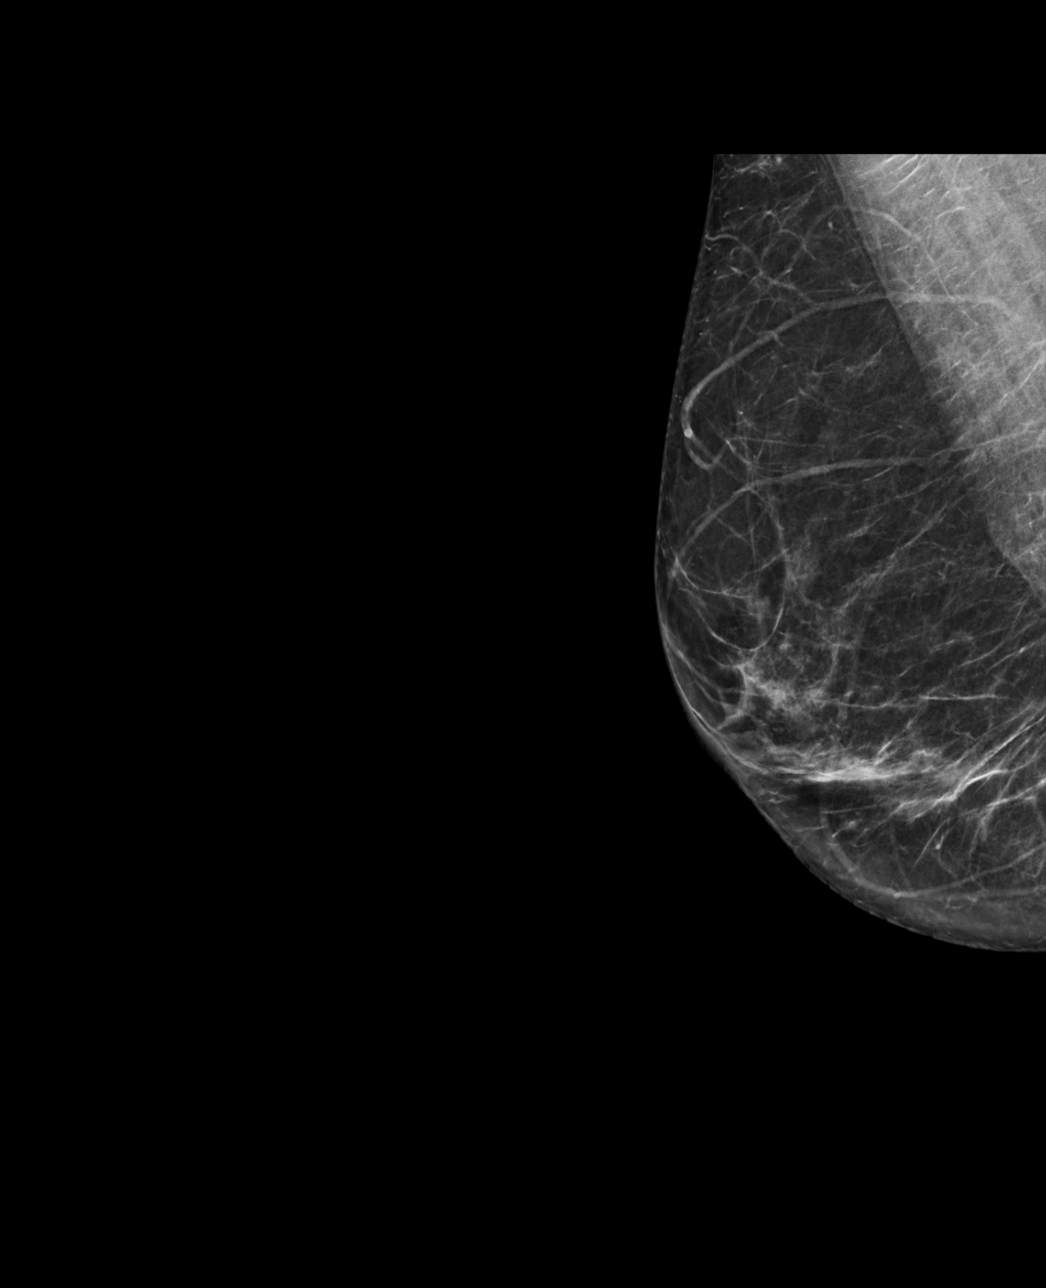

[R CC synth-2D]
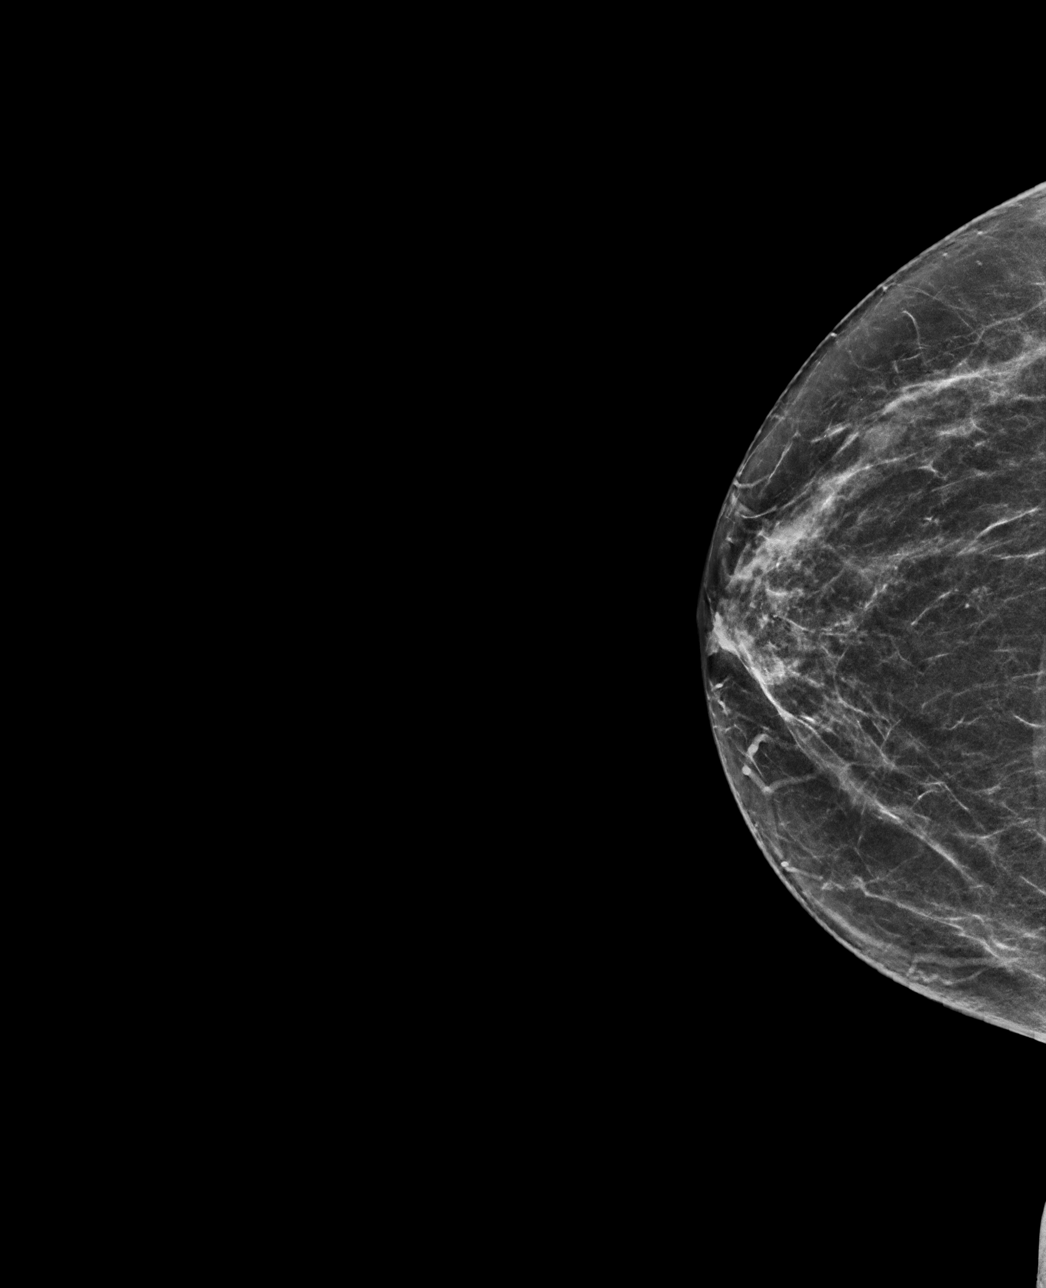

[R MLO tomo · tomo slice 35/70.0]
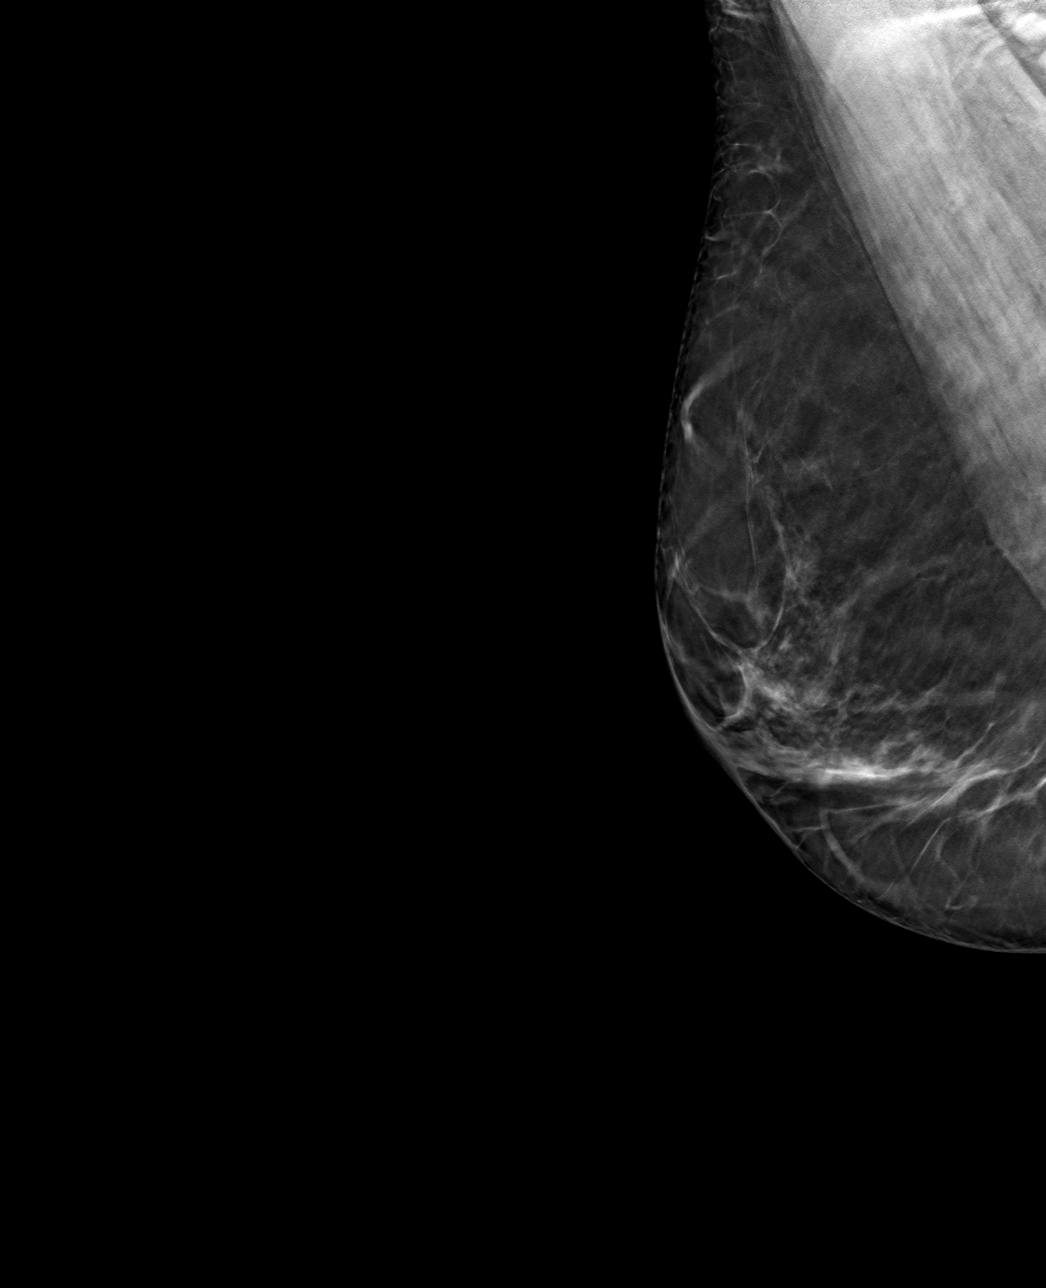

[R CC tomo · tomo slice 33/64.0]
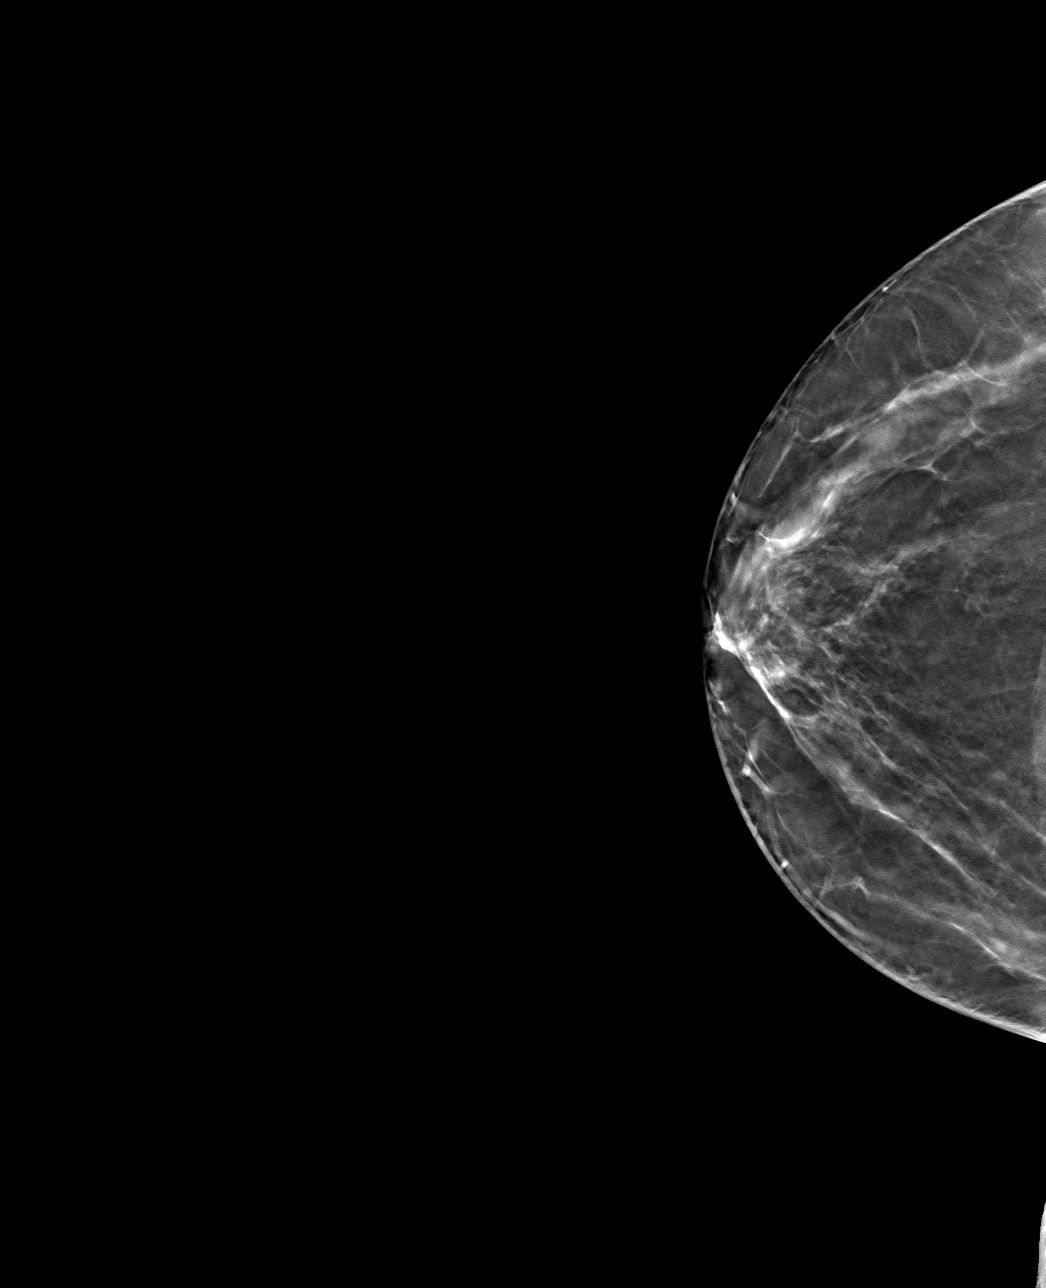

[4 of 12 positions shown; findings below may reference images not displayed]

ACR Breast Density Category b: There are scattered areas of
fibroglandular density.
FINDINGS: 2D/3D full field views of the RIGHT breast demonstrate decreased
size of the UPPER RIGHT breast asymmetry.

No new or suspicious mammographic findings are noted.

Mammographic images were processed with CAD.

Targeted ultrasound of the RIGHT breast is performed showing the
following:

A 0.5 x 0.2 x 0.4 cm circumscribed oval hypoechoic parallel mass at
the [DATE] position of the RIGHT breast 3 cm from the nipple,
previously measuring 0.6 x 0.3 x 0.6 cm, and compatible with a
benign process given decrease in size

A 0.9 x 0.2 x 0.5 cm cluster of microcysts at the [DATE] position of
the RIGHT breast 4 cm from the nipple, decreased in size from the
prior study and compatible with a benign process.
IMPRESSION: 1. Decreased size of 2 RIGHT breast masses as described above,
compatible with benign processes.

RECOMMENDATION:
Bilateral screening mammogram in 6 months to resume annual mammogram
schedule.

I have discussed the findings and recommendations with the patient.
If applicable, a reminder letter will be sent to the patient
regarding the next appointment.

BI-RADS CATEGORY  2: Benign.

## 2023-03-11 ENCOUNTER — Other Ambulatory Visit: Payer: Self-pay | Admitting: Obstetrics & Gynecology

## 2023-03-11 DIAGNOSIS — Z9189 Other specified personal risk factors, not elsewhere classified: Secondary | ICD-10-CM

## 2023-03-13 ENCOUNTER — Encounter: Payer: Self-pay | Admitting: Obstetrics & Gynecology

## 2023-04-04 ENCOUNTER — Ambulatory Visit
Admission: RE | Admit: 2023-04-04 | Discharge: 2023-04-04 | Disposition: A | Discharge status: Home / Self Care | Payer: Commercial Managed Care - PPO | Source: Ambulatory Visit | Attending: Obstetrics & Gynecology | Admitting: Obstetrics & Gynecology

## 2023-04-04 DIAGNOSIS — Z9189 Other specified personal risk factors, not elsewhere classified: Secondary | ICD-10-CM

## 2023-04-04 MED ORDER — GADOPICLENOL 0.5 MMOL/ML IV SOLN
10.0000 mL | Freq: Once | INTRAVENOUS | Status: AC | PRN
  Administered 2023-04-04: 10 mL via INTRAVENOUS

## 2023-09-20 ENCOUNTER — Emergency Department: Payer: Commercial Managed Care - PPO

## 2023-09-20 ENCOUNTER — Other Ambulatory Visit: Payer: Self-pay

## 2023-09-20 ENCOUNTER — Emergency Department
Admission: EM | Admit: 2023-09-20 | Discharge: 2023-09-20 | Disposition: A | Discharge status: Home / Self Care | Payer: Commercial Managed Care - PPO | Attending: Emergency Medicine | Admitting: Emergency Medicine

## 2023-09-20 DIAGNOSIS — E236 Other disorders of pituitary gland: Secondary | ICD-10-CM | POA: Diagnosis not present

## 2023-09-20 DIAGNOSIS — R110 Nausea: Secondary | ICD-10-CM | POA: Diagnosis not present

## 2023-09-20 DIAGNOSIS — R519 Headache, unspecified: Secondary | ICD-10-CM | POA: Diagnosis present

## 2023-09-20 LAB — BASIC METABOLIC PANEL
Anion gap: 8 (ref 5–15)
BUN: 9 mg/dL (ref 6–20)
CO2: 29 mmol/L (ref 22–32)
Calcium: 9.1 mg/dL (ref 8.9–10.3)
Chloride: 100 mmol/L (ref 98–111)
Creatinine, Ser: 1 mg/dL (ref 0.44–1.00)
GFR, Estimated: >60 mL/min (ref >60)
Glucose, Bld: 104 mg/dL — ABNORMAL HIGH (ref 70–99)
Potassium: 3.6 mmol/L (ref 3.5–5.1)
Sodium: 137 mmol/L (ref 135–145)

## 2023-09-20 LAB — CBC WITH DIFFERENTIAL/PLATELET
Abs Immature Granulocytes: 0.01 10*3/uL (ref 0.00–0.07)
Basophils Absolute: 0 10*3/uL (ref 0.0–0.1)
Basophils Relative: 0 %
Eosinophils Absolute: 0 10*3/uL (ref 0.0–0.5)
Eosinophils Relative: 1 %
HCT: 41.3 % (ref 36.0–46.0)
Hemoglobin: 13.5 g/dL (ref 12.0–15.0)
Immature Granulocytes: 0 %
Lymphocytes Relative: 50 %
Lymphs Abs: 2.8 10*3/uL (ref 0.7–4.0)
MCH: 32.5 pg (ref 26.0–34.0)
MCHC: 32.7 g/dL (ref 30.0–36.0)
MCV: 99.3 fL (ref 80.0–100.0)
Monocytes Absolute: 0.4 10*3/uL (ref 0.1–1.0)
Monocytes Relative: 7 %
Neutro Abs: 2.4 10*3/uL (ref 1.7–7.7)
Neutrophils Relative %: 42 %
Platelets: 203 10*3/uL (ref 150–400)
RBC: 4.16 MIL/uL (ref 3.87–5.11)
RDW: 12.4 % (ref 11.5–15.5)
WBC: 5.7 10*3/uL (ref 4.0–10.5)
nRBC: 0 % (ref 0.0–0.2)

## 2023-09-20 LAB — CORTISOL: Cortisol, Plasma: 5.5 ug/dL

## 2023-09-20 LAB — TSH: TSH: 0.51 u[IU]/mL (ref 0.350–4.500)

## 2023-09-20 LAB — T4, FREE: Free T4: 0.56 ng/dL — ABNORMAL LOW (ref 0.61–1.12)

## 2023-09-20 MED ORDER — DIPHENHYDRAMINE HCL 50 MG/ML IJ SOLN
25.0000 mg | Freq: Once | INTRAMUSCULAR | Status: AC
  Administered 2023-09-20: 25 mg via INTRAVENOUS
  Filled 2023-09-20: qty 1

## 2023-09-20 MED ORDER — OXYCODONE HCL 5 MG PO TABS: 5.0000 mg | ORAL_TABLET | Freq: Three times a day (TID) | ORAL | 0 refills | Status: AC | PRN

## 2023-09-20 MED ORDER — GADOBUTROL 1 MMOL/ML IV SOLN
9.0000 mL | Freq: Once | INTRAVENOUS | Status: AC | PRN
  Administered 2023-09-20: 9 mL via INTRAVENOUS

## 2023-09-20 MED ORDER — IOHEXOL 350 MG/ML SOLN
75.0000 mL | Freq: Once | INTRAVENOUS | Status: AC | PRN
  Administered 2023-09-20: 75 mL via INTRAVENOUS

## 2023-09-20 MED ORDER — MORPHINE SULFATE (PF) 4 MG/ML IV SOLN
4.0000 mg | Freq: Once | INTRAVENOUS | Status: AC
  Administered 2023-09-20: 4 mg via INTRAVENOUS
  Filled 2023-09-20: qty 1

## 2023-09-20 MED ORDER — PROCHLORPERAZINE EDISYLATE 10 MG/2ML IJ SOLN
5.0000 mg | Freq: Once | INTRAMUSCULAR | Status: AC
  Administered 2023-09-20: 5 mg via INTRAVENOUS
  Filled 2023-09-20: qty 2

## 2023-09-20 NOTE — ED Notes (Signed)
See triage noe  Presents with a 3 day hx of headache  Some nausea  No relief with OTC meds  Denies any fever or trauma

## 2023-09-20 NOTE — ED Triage Notes (Addendum)
Pt comes with c/o headache that started on Friday. Pt states it has been on and off for month but this past Friday it got worse. Pt states it is right sided over her eye and radiates back. Pt states nausea.   Pt states she has been having sweats also and did take covid test last night and it was negative.

## 2023-09-20 NOTE — ED Notes (Signed)
Back from MRI  Family remains at bedside

## 2023-09-20 NOTE — ED Provider Notes (Signed)
[facility] Provider Note    Event Date/Time   First MD Initiated Contact with Patient 09/20/23 [phone]     (approximate)   History   Headache   HPI Karen Sheppard is a 57 y.o. female with no significant medical history presenting today for right-sided headache.  Patient states she has had right-sided headache that has been on and off over the past month.  Mostly the right side of her head as well as behind her right eye.  She has tried NSAIDs and over-the-counter medication with minimal relief.  Symptoms do seem to worsen when she is working at her computer and staring at 3 screens during the day.  Notes intermittent dizziness symptoms but no lightheaded symptoms.  Endorses nausea but no vomiting.  Otherwise, denies vision changes, hearing loss, ringing of her ears, ear pain, numbness or weakness to any of her extremities.     Physical Exam   Triage Vital Signs: ED Triage Vitals  Encounter Vitals Group     BP 09/20/23 0726 (!) 151/99     Systolic BP Percentile --      Diastolic BP Percentile --      Pulse Rate 09/20/23 0726 (!) 56     Resp 09/20/23 0726 18     Temp 09/20/23 0726 98 F (36.7 C)     Temp src --      SpO2 09/20/23 0726 98 %     Weight --      Height --      Head Circumference --      Peak Flow --      Pain Score 09/20/23 0727 7     Pain Loc --      Pain Education --      Exclude from Growth Chart --     Most recent vital signs: Vitals:   09/20/23 1130 09/20/23 1333  BP: (!) 148/98 (!) 140/90  Pulse: (!) 59 (!) 55  Resp: 16 16  Temp: 98 F (36.7 C)   SpO2: 98% 98%   I have reviewed the vital signs. General:  Awake, alert, no acute distress. Head:  Normocephalic, Atraumatic. EENT:  PERRL, EOMI, Oral mucosa pink and moist, Neck is supple.  Bilateral external ear canals normal with normal TM with no bulging or erythema.  Vision intact. Cardiovascular: Regular rate, 2+ distal pulses. Respiratory:  Normal respiratory effort,  symmetrical expansion, no distress.   Extremities:  Moving all four extremities through full ROM without pain.   Neuro:  Alert and oriented.  Interacting appropriately.  Cranial nerves II through XII intact.  No obvious vertigo symptoms or nystagmus elicited during provocative testing.  Ambulating without difficulty and no ataxia. Skin:  Warm, dry, no rash.   Psych: Appropriate affect.    ED Results / Procedures / Treatments   Labs (all labs ordered are listed, but only abnormal results are displayed) Labs Reviewed  BASIC METABOLIC PANEL - Abnormal; Notable for the following components:      Result Value   Glucose, Bld 104 (*)    All other components within normal limits  T4, FREE - Abnormal; Notable for the following components:   Free T4 0.56 (*)    All other components within normal limits  CBC WITH DIFFERENTIAL/PLATELET  TSH  GROWTH HORMONE  FOLLICLE STIMULATING HORMONE  CORTISOL  T3, FREE  ACTH  PROLACTIN     EKG    RADIOLOGY Independently interpreted CT and MRI and agree with radiology reads  PROCEDURES:  Critical Care performed: No  Procedures   MEDICATIONS ORDERED IN ED: Medications  prochlorperazine (COMPAZINE) injection 5 mg (5 mg Intravenous Given 09/20/23 0956)  diphenhydrAMINE (BENADRYL) injection 25 mg (25 mg Intravenous Given 09/20/23 0956)  iohexol (OMNIPAQUE) 350 MG/ML injection 75 mL (75 mLs Intravenous Contrast Given 09/20/23 1023)  morphine (PF) 4 MG/ML injection 4 mg (4 mg Intravenous Given 09/20/23 1141)  gadobutrol (GADAVIST) 1 MMOL/ML injection 9 mL (9 mLs Intravenous Contrast Given 09/20/23 1311)     IMPRESSION / MDM / ASSESSMENT AND PLAN / ED COURSE  I reviewed the triage vital signs and the nursing notes.                              Differential diagnosis includes, but is not limited to, cluster headache, migraine headache, tension headaches, less likely carotid/vertebral artery dissection, new intracranial mass.  Patient's  presentation is most consistent with acute presentation with potential threat to life or bodily function.  Patient is a 57 year old female presenting today for intermittent headaches over the past 1 month.  No associated vision changes.  Able to ambulate without difficulty.  No hearing loss or other neurological symptoms present.  Given chronicity of symptoms as well as unilateral side, will evaluate with laboratory workup and CT imaging.  Laboratory workup mostly reassuring.  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  No associated edema or mass effect.  Consulted neurosurgery.  Dr. Lennette Bihari recommends follow-up MRI as well as sending off laboratory workup for outpatient follow-up.  Thyroid labs resulted which shows only mild T4 low level.  The rest of her thyroid panel was within normal limits.  Discussed the case again with Dr. Madaline Brilliant with MRI.  Does think patient is safe for discharge at this time with no new medications needed at this time.  The [facility] endocrine/neurosurgery multi disciplinary team will call her tomorrow to set up outpatient visit within the next week for ongoing management.  Patient was given strict return precautions and agreeable with plan.  The patient is on the cardiac monitor to evaluate for evidence of arrhythmia and/or significant heart rate changes. 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 The mass also extends into the suprasellar region and exerts mass effect on the prechiasmatic optic nerves. Recommend MRI of the brain/skull base with and without contrast and  neurosurgical referral. [DW]  1102 Dr. Madaline Brilliant - Neurosurgery - would get MRI w/ and w/o. Send off cortisol, TSH, FSH, growth hormone, and thyroid panel. Follow up in the clinic within the next week [DW]  1406 Dr. Madaline Brilliant with neurosurgery reviewed MRI.  Okay for patient to be discharged and she will follow-up in their multidisciplinary endocrine/neurosurgery clinic for ongoing management this week [DW]    Clinical Course User Index [DW] Janith Lima, MD     FINAL CLINICAL IMPRESSION(S) / ED DIAGNOSES   Final diagnoses:  Pituitary mass [facility])     Rx / DC Orders   ED Discharge Orders          Ordered    oxyCODONE (ROXICODONE) 5  MG immediate release tablet  Every 8 hours PRN        09/20/23 1408             Note:  This document was prepared using Dragon voice recognition software and may include unintentional dictation errors.   Janith Lima, MD 09/20/23 [phone]

## 2023-09-20 NOTE — Progress Notes (Signed)
Neurosurgery brief note  Was contacted by the emergency room regarding this patient.  She had presented with headaches and underwent CTA imaging the brain which demonstrated a sellar lesion with extension over the right cavernous sinus with some suprasellar extension.  She denies any double vision loss of vision or changes in her vision.  Endocrine laboratories have been sent and are pending and are in process.  She is neurologically intact otherwise per report  IMPRESSION: 1. 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 The mass also extends into the suprasellar region and exerts mass effect on the prechiasmatic optic nerves. Recommend MRI of the brain/skull base with and without contrast and neurosurgical referral. 2. Patent vasculature of the head and neck without significant stenosis, occlusion, or dissection.  MRI imaging completed demonstrates homogeneous enhancing lesion in the area of the sella with extension over the right cavernous sinus with a suprasellar extension with contact and abutment of the undersurface of the optic apparatus consistent with a pituitary adenoma  AP: Overall the patient presents with what is likely a pituitary adenoma in the setting of a right-sided headache.  She has HPA axis labs pending which will determine whether she needs endocrine supplementation she is not having any obvious signs or symptoms currently of adrenal insufficiency.  We will need to arrange outpatient multidisciplinary pituitary clinic follow-up and I will make arrangements for this.  In the meantime her endocrine laboratories are pending.  She may be discharged with this follow-up and knows to reach out to [place] should any of her concerns symptoms arise in the meantime we will arrange this follow-up  Melina Modena MD Neurosurgery

## 2023-09-20 NOTE — Discharge Instructions (Addendum)
Imaging today found a new mass around your pituitary that is concerning for possible cancer.  The [facility] neurosurgery/endocrine team is going to call you tomorrow to set up an outpatient appointment for ongoing follow-up and management.  Please give them a call if you have not heard anything within the next 48 hours to set up that appointment.  Please return for any worsening symptoms.

## 2023-09-22 LAB — FOLLICLE STIMULATING HORMONE: FSH: 0.7 m[IU]/mL

## 2023-09-22 LAB — GROWTH HORMONE: Growth Hormone: 0.8 ng/mL (ref 0.0–10.0)

## 2023-09-22 LAB — T3, FREE: T3, Free: 2.1 pg/mL (ref 2.0–4.4)

## 2023-09-22 NOTE — ED Notes (Signed)
Patient called due to [facility] told her they have no info or referral on her.  I called [facility] neuro and explained situation and need for appt this week per Dr. Kara Pacer instructions.  I faxed the patient chart to them  [phone].  She will get in touch with Dr. Kara Pacer office now.

## 2024-04-18 ENCOUNTER — Other Ambulatory Visit: Payer: Self-pay | Admitting: Obstetrics & Gynecology

## 2024-04-18 DIAGNOSIS — Z9189 Other specified personal risk factors, not elsewhere classified: Secondary | ICD-10-CM

## 2024-04-22 ENCOUNTER — Encounter: Payer: Self-pay | Admitting: Obstetrics & Gynecology

## 2024-05-11 ENCOUNTER — Ambulatory Visit
Admission: RE | Admit: 2024-05-11 | Discharge: 2024-05-11 | Disposition: A | Discharge status: Home / Self Care | Source: Ambulatory Visit | Attending: Obstetrics & Gynecology | Admitting: Obstetrics & Gynecology

## 2024-05-11 DIAGNOSIS — Z9189 Other specified personal risk factors, not elsewhere classified: Secondary | ICD-10-CM

## 2024-05-11 MED ORDER — GADOPICLENOL 0.5 MMOL/ML IV SOLN
9.0000 mL | Freq: Once | INTRAVENOUS | Status: AC | PRN
  Administered 2024-05-11: 9 mL via INTRAVENOUS
